# Patient Record
Sex: Male | Born: 1970 | Race: Black or African American | Hispanic: No | Marital: Single | Smoking: Current every day smoker
Health system: Southern US, Community
[De-identification: ages and names within clinical notes are randomized; demographics above are authoritative.]

## PROBLEM LIST (undated history)

## (undated) HISTORY — PX: MANDIBLE FRACTURE SURGERY: SHX706

---

## 1999-08-22 ENCOUNTER — Emergency Department (HOSPITAL_COMMUNITY): Admission: EM | Admit: 1999-08-22 | Discharge: 1999-08-22 | Payer: Self-pay | Admitting: Emergency Medicine

## 2009-06-15 ENCOUNTER — Inpatient Hospital Stay (HOSPITAL_COMMUNITY): Admission: EM | Admit: 2009-06-15 | Discharge: 2009-06-16 | Payer: Self-pay | Admitting: Emergency Medicine

## 2009-07-28 ENCOUNTER — Ambulatory Visit (HOSPITAL_COMMUNITY): Admission: RE | Admit: 2009-07-28 | Discharge: 2009-07-28 | Payer: Self-pay | Admitting: Oral Surgery

## 2011-01-24 LAB — HEPATIC FUNCTION PANEL
Albumin: 4 g/dL (ref 3.5–5.2)
Bilirubin, Direct: 0.1 mg/dL (ref 0.0–0.3)
Total Bilirubin: 0.9 mg/dL (ref 0.3–1.2)

## 2011-01-24 LAB — ANAEROBIC CULTURE

## 2011-01-24 LAB — CBC
HCT: 41.4 % (ref 39.0–52.0)
Hemoglobin: 13.9 g/dL (ref 13.0–17.0)
MCHC: 33.6 g/dL (ref 30.0–36.0)
Platelets: 299 10*3/uL (ref 150–400)
RDW: 12.6 % (ref 11.5–15.5)

## 2011-01-24 LAB — BASIC METABOLIC PANEL
BUN: 9 mg/dL (ref 6–23)
CO2: 27 mEq/L (ref 19–32)
Calcium: 9.6 mg/dL (ref 8.4–10.5)
Glucose, Bld: 111 mg/dL — ABNORMAL HIGH (ref 70–99)
Sodium: 138 mEq/L (ref 135–145)

## 2011-01-24 LAB — CULTURE, ROUTINE-ABSCESS

## 2011-01-26 LAB — DIFFERENTIAL
Basophils Absolute: 0 10*3/uL (ref 0.0–0.1)
Lymphocytes Relative: 14 % (ref 12–46)
Lymphs Abs: 1.5 10*3/uL (ref 0.7–4.0)
Monocytes Absolute: 0.9 10*3/uL (ref 0.1–1.0)
Monocytes Relative: 8 % (ref 3–12)
Neutro Abs: 8.4 10*3/uL — ABNORMAL HIGH (ref 1.7–7.7)

## 2011-01-26 LAB — BASIC METABOLIC PANEL
Calcium: 8.8 mg/dL (ref 8.4–10.5)
GFR calc Af Amer: >60 mL/min (ref >60)
GFR calc non Af Amer: >60 mL/min (ref >60)
Sodium: 138 mEq/L (ref 135–145)

## 2011-01-26 LAB — PROTIME-INR: INR: 1.1 (ref 0.00–1.49)

## 2011-01-26 LAB — CBC
Hemoglobin: 12.6 g/dL — ABNORMAL LOW (ref 13.0–17.0)
RBC: 3.82 MIL/uL — ABNORMAL LOW (ref 4.22–5.81)
WBC: 10.8 10*3/uL — ABNORMAL HIGH (ref 4.0–10.5)
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
## 2011-03-05 NOTE — Op Note (Signed)
NAMEARSEN, MANGIONE                ACCOUNT NO.:  [id number]   MEDICAL RECORD NO.:  [id number]          PATIENT TYPE:  INP   LOCATION:  5127                         FACILITY:  [facility]   PHYSICIAN:  Georgia Lopes, M.D.  DATE OF BIRTH:  [birth year]   DATE OF PROCEDURE:  06/15/2009  DATE OF DISCHARGE:                               OPERATIVE REPORT   OPERATIVE DIAGNOSIS:  Right mandibular parasymphaseal fracture, Left  subcondylar fracture of mandible   POSTOPERATIVE DIAGNOSIS:  Right mandibular parasymphaseal fracture, left  subcondylar fracture of mandible   PROCEDURE:  Open reduction, internal fixation, mandibular right  parasymphaseal fracture, Closed reduction Left Subcondylar fracture of  mandible   SURGEON:  Georgia Lopes, MD   ANESTHESIA:  General, nasal, Dr. Ivin Booty was the attending.   PROCEDURE:  The patient was taken to the operating room and placed on  the table in the supine position.  General anesthesia was administered  via intravenous medications and a nasotracheal tube was placed  atraumatically and secured and the eyes were protected, and the patient  was draped for the procedure.  Posterior pharynx was suctioned and a  throat pack was placed.  A 2% lidocaine with 1:100,000 epinephrine was  infiltrated and an inferior alveolar block on the right and left side  and buccal infiltration in the right symphysial area.  Then, Erich arch  bars were adapted into the maxilla with 24 gauge wire posteriorly and 26-  gauge wires anteriorly.  The arch bar was split in the mandible so that  the segment on the right side received an arch bar with a 24 and 26  gauge wires ligated to the teeth and the  segment on the left had an  arch bar attached to the teeth with 24 and 26 gauge wire.  The fracture  was between teeth numbers 26 and 27.  Then, the occlusion was assessed  and the patient was placed in an intermaxillary fixation manually and a  26-gauge wire was used to ligate the  right arch bar to the left arch  bar.  Then, intermaxillary fixation was placed with 24-gauge wires and a  15 blade was used make a full-thickness incision approximately 4 cm long  the anterior mandible through the mucosa and the submucosal tissues and  down to the periosteum.  Periosteal elevator was used to reflect the  periosteum and the fracture was identified.  The mental nerve was  identified and protected during the procedure.  Then, a four-hole bone  plate was adapted to the fracture.  The fracture was nondisplaced at  that time after the closed reduction and a four-hole bone plate was  adapted and screws were placed, 10-mm 2.3 x 4.  The area was irrigated  and closed in layers with 3-0 chromic.  Then, the maxillary fixation was  released.  The oral cavity was irrigated and suctioned and the throat  pack was removed.  Then, the intermaxillary fixation was reapplied using  24 and 26 gauge wires.  At the termination of the procedure, the  fractures were reduced and occlusion was stable,  jaws were wired shut.   ESTIMATED BLOOD LOSS:  Minimum.   COMPLICATIONS:  None.   SPECIMENS:  None.   DISPOSITION:  To recovery room in good condition, breathing  spontaneously.      Georgia Lopes, M.D.  Electronically Signed     SMJ/MEDQ  D:  06/15/2009  T:  06/16/2009  Job:  [id number]

## 2011-04-16 ENCOUNTER — Emergency Department (HOSPITAL_COMMUNITY)
Admission: EM | Admit: 2011-04-16 | Discharge: 2011-04-16 | Disposition: A | Discharge status: Home / Self Care | Payer: Self-pay | Attending: Emergency Medicine | Admitting: Emergency Medicine

## 2011-04-16 DIAGNOSIS — Z0000 Encounter for general adult medical examination without abnormal findings: Secondary | ICD-10-CM | POA: Insufficient documentation

## 2011-04-16 DIAGNOSIS — Z202 Contact with and (suspected) exposure to infections with a predominantly sexual mode of transmission: Secondary | ICD-10-CM | POA: Insufficient documentation

## 2011-04-16 DIAGNOSIS — F1210 Cannabis abuse, uncomplicated: Secondary | ICD-10-CM | POA: Insufficient documentation

## 2011-04-16 LAB — URINALYSIS, ROUTINE W REFLEX MICROSCOPIC
Ketones, ur: NEGATIVE mg/dL
Leukocytes, UA: NEGATIVE
Nitrite: NEGATIVE
pH: 6 (ref 5.0–8.0)

## 2011-09-03 ENCOUNTER — Emergency Department (INDEPENDENT_AMBULATORY_CARE_PROVIDER_SITE_OTHER): Payer: Self-pay

## 2011-09-03 ENCOUNTER — Encounter: Payer: Self-pay | Admitting: Emergency Medicine

## 2011-09-03 ENCOUNTER — Emergency Department (INDEPENDENT_AMBULATORY_CARE_PROVIDER_SITE_OTHER)
Admission: EM | Admit: 2011-09-03 | Discharge: 2011-09-03 | Disposition: A | Discharge status: Home / Self Care | Payer: Self-pay | Source: Home / Self Care | Attending: Family Medicine | Admitting: Family Medicine

## 2011-09-03 DIAGNOSIS — S2239XA Fracture of one rib, unspecified side, initial encounter for closed fracture: Secondary | ICD-10-CM

## 2011-09-03 MED ORDER — CYCLOBENZAPRINE HCL 5 MG PO TABS: 5.0000 mg | ORAL_TABLET | Freq: Three times a day (TID) | ORAL | Status: AC | PRN

## 2011-09-03 MED ORDER — NAPROXEN 500 MG PO TABS: 500.0000 mg | ORAL_TABLET | Freq: Two times a day (BID) | ORAL | Status: AC

## 2011-09-03 MED ORDER — HYDROCODONE-ACETAMINOPHEN 5-325 MG PO TABS: ORAL_TABLET | ORAL | Status: AC
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
## 2012-09-22 ENCOUNTER — Emergency Department (INDEPENDENT_AMBULATORY_CARE_PROVIDER_SITE_OTHER)
Admission: EM | Admit: 2012-09-22 | Discharge: 2012-09-22 | Disposition: A | Discharge status: Home / Self Care | Payer: Self-pay | Source: Home / Self Care | Attending: Family Medicine | Admitting: Family Medicine

## 2012-09-22 ENCOUNTER — Encounter (HOSPITAL_COMMUNITY): Payer: Self-pay | Admitting: Emergency Medicine

## 2012-09-22 DIAGNOSIS — H16209 Unspecified keratoconjunctivitis, unspecified eye: Secondary | ICD-10-CM

## 2012-09-22 DIAGNOSIS — H16202 Unspecified keratoconjunctivitis, left eye: Secondary | ICD-10-CM

## 2012-09-22 MED ORDER — TETRACAINE HCL 0.5 % OP SOLN
OPHTHALMIC | Status: AC
  Filled 2012-09-22: qty 2

## 2012-09-22 MED ORDER — TOBRAMYCIN 0.3 % OP OINT
TOPICAL_OINTMENT | Freq: Three times a day (TID) | OPHTHALMIC | Status: DC
  Administered 2012-09-22: 15:00:00 via OPHTHALMIC

## 2012-09-22 MED ORDER — TOBRAMYCIN 0.3 % OP OINT
TOPICAL_OINTMENT | OPHTHALMIC | Status: AC
  Filled 2012-09-22: qty 3.5

## 2012-09-22 NOTE — ED Notes (Signed)
Tobramycin applied by dr Artis Flock per patient .  Patient reported dr Artis Flock gave instructions.

## 2012-09-22 NOTE — ED Notes (Signed)
Placed patch on left eye

## 2012-09-22 NOTE — ED Notes (Signed)
Reports yesterday morning woke with left eye red, draining , and painful.  Patient wears contacts, disposables.  Removed contact yesterday and has remained out of left eye.

## 2012-09-22 NOTE — ED Provider Notes (Signed)
History     CSN: [id number]  Arrival date & time 09/22/12  1300   First MD Initiated Contact with Patient 09/22/12 1318      Chief Complaint  Patient presents with  . Eye Pain    (Consider location/radiation/quality/duration/timing/severity/associated sxs/prior treatment) Patient is a 41 y.o. male presenting with eye pain. The history is provided by the patient.  Eye Pain This is a new problem. The current episode started yesterday. The problem has been gradually worsening. Associated symptoms comments: Onset after extended wear of contact, has happened before.Marland Kitchen    History reviewed. No pertinent past medical history.  Past Surgical History  Procedure Date  . Mandible fracture surgery     No family history on file.  History  Substance Use Topics  . Smoking status: Current Every Day Smoker  . Smokeless tobacco: Not on file  . Alcohol Use: Yes      Review of Systems  Constitutional: Negative.   HENT: Negative.   Eyes: Positive for photophobia, pain and redness. Negative for discharge and itching.    Allergies  Review of patient's allergies indicates no known allergies.  Home Medications   Current Outpatient Rx  Name  Route  Sig  Dispense  Refill  . ACETAMINOPHEN 325 MG PO TABS   Oral   Take 650 mg by mouth every 6 (six) hours as needed.           BP 146/96  Pulse 76  Temp 98.7 F (37.1 C) (Oral)  Resp 18  SpO2 100%  Physical Exam  Nursing note and vitals reviewed. Constitutional: He appears well-developed and well-nourished.  HENT:  Head: Normocephalic.  Right Ear: External ear normal.  Left Ear: External ear normal.  Mouth/Throat: Oropharynx is clear and moist.  Eyes: EOM are normal. Pupils are equal, round, and reactive to light. Left eye exhibits no discharge and no exudate. Right conjunctiva is not injected. Left conjunctiva is injected. Left conjunctiva has no hemorrhage.  Slit lamp exam:      The right eye shows no fluorescein uptake and  no anterior chamber bulge.       The left eye shows fluorescein uptake. The left eye shows no corneal abrasion, no corneal flare, no corneal ulcer and no anterior chamber bulge.    ED Course  Procedures (including critical care time)  Labs Reviewed - No data to display No results found.   No diagnosis found.    MDM          Linna Hoff, MD 09/22/12 1430

## 2015-04-13 ENCOUNTER — Emergency Department (HOSPITAL_COMMUNITY): Payer: Medicaid Other

## 2015-04-13 ENCOUNTER — Inpatient Hospital Stay (HOSPITAL_COMMUNITY)
Admission: EM | Admit: 2015-04-13 | Discharge: 2015-04-21 | DRG: 082 | Disposition: E | Payer: Medicaid Other | Attending: General Surgery | Admitting: General Surgery

## 2015-04-13 DIAGNOSIS — D62 Acute posthemorrhagic anemia: Secondary | ICD-10-CM | POA: Diagnosis present

## 2015-04-13 DIAGNOSIS — R578 Other shock: Secondary | ICD-10-CM | POA: Diagnosis present

## 2015-04-13 DIAGNOSIS — F10129 Alcohol abuse with intoxication, unspecified: Secondary | ICD-10-CM | POA: Diagnosis present

## 2015-04-13 DIAGNOSIS — S0193XA Puncture wound without foreign body of unspecified part of head, initial encounter: Secondary | ICD-10-CM

## 2015-04-13 DIAGNOSIS — R001 Bradycardia, unspecified: Secondary | ICD-10-CM | POA: Diagnosis present

## 2015-04-13 DIAGNOSIS — X72XXXA Intentional self-harm by handgun discharge, initial encounter: Secondary | ICD-10-CM

## 2015-04-13 DIAGNOSIS — S020XXB Fracture of vault of skull, initial encounter for open fracture: Secondary | ICD-10-CM | POA: Diagnosis present

## 2015-04-13 DIAGNOSIS — S069XAA Unspecified intracranial injury with loss of consciousness status unknown, initial encounter: Secondary | ICD-10-CM | POA: Diagnosis present

## 2015-04-13 DIAGNOSIS — J9601 Acute respiratory failure with hypoxia: Secondary | ICD-10-CM | POA: Diagnosis present

## 2015-04-13 DIAGNOSIS — G9382 Brain death: Secondary | ICD-10-CM | POA: Diagnosis present

## 2015-04-13 DIAGNOSIS — S0990XA Unspecified injury of head, initial encounter: Secondary | ICD-10-CM | POA: Diagnosis present

## 2015-04-13 DIAGNOSIS — F17210 Nicotine dependence, cigarettes, uncomplicated: Secondary | ICD-10-CM | POA: Diagnosis present

## 2015-04-13 DIAGNOSIS — N179 Acute kidney failure, unspecified: Secondary | ICD-10-CM | POA: Diagnosis present

## 2015-04-13 DIAGNOSIS — G936 Cerebral edema: Secondary | ICD-10-CM | POA: Diagnosis present

## 2015-04-13 DIAGNOSIS — S069X9A Unspecified intracranial injury with loss of consciousness of unspecified duration, initial encounter: Secondary | ICD-10-CM | POA: Diagnosis present

## 2015-04-13 DIAGNOSIS — N289 Disorder of kidney and ureter, unspecified: Secondary | ICD-10-CM | POA: Diagnosis present

## 2015-04-13 DIAGNOSIS — J9600 Acute respiratory failure, unspecified whether with hypoxia or hypercapnia: Secondary | ICD-10-CM | POA: Diagnosis present

## 2015-04-13 DIAGNOSIS — W3400XA Accidental discharge from unspecified firearms or gun, initial encounter: Secondary | ICD-10-CM

## 2015-04-13 LAB — PREPARE FRESH FROZEN PLASMA
Unit division: 0
Unit division: 0

## 2015-04-13 LAB — TYPE AND SCREEN
ABO/RH(D): A POS
ANTIBODY SCREEN: NEGATIVE
UNIT DIVISION: 0
Unit division: 0

## 2015-04-13 LAB — COMPREHENSIVE METABOLIC PANEL
ALBUMIN: 2.9 g/dL — AB (ref 3.5–5.0)
ALK PHOS: 39 U/L (ref 38–126)
ALT: 16 U/L — ABNORMAL LOW (ref 17–63)
ANION GAP: 9 (ref 5–15)
AST: 31 U/L (ref 15–41)
BUN: 9 mg/dL (ref 6–20)
CALCIUM: 7.4 mg/dL — AB (ref 8.9–10.3)
CHLORIDE: 108 mmol/L (ref 101–111)
CO2: 21 mmol/L — AB (ref 22–32)
Creatinine, Ser: 1.38 mg/dL — ABNORMAL HIGH (ref 0.61–1.24)
GFR calc Af Amer: >60 mL/min (ref >60)
GFR calc non Af Amer: >60 mL/min (ref >60)
GLUCOSE: 286 mg/dL — AB (ref 65–99)
POTASSIUM: 3.6 mmol/L (ref 3.5–5.1)
SODIUM: 138 mmol/L (ref 135–145)
TOTAL PROTEIN: 4.7 g/dL — AB (ref 6.5–8.1)
Total Bilirubin: 0.2 mg/dL — ABNORMAL LOW (ref 0.3–1.2)

## 2015-04-13 LAB — I-STAT CHEM 8, ED
BUN: 12 mg/dL (ref 6–20)
CHLORIDE: 105 mmol/L (ref 101–111)
Calcium, Ion: 1.08 mmol/L — ABNORMAL LOW (ref 1.12–1.23)
Creatinine, Ser: 1.7 mg/dL — ABNORMAL HIGH (ref 0.61–1.24)
Glucose, Bld: 281 mg/dL — ABNORMAL HIGH (ref 65–99)
HEMATOCRIT: 28 % — AB (ref 39.0–52.0)
HEMOGLOBIN: 9.5 g/dL — AB (ref 13.0–17.0)
Potassium: 3.8 mmol/L (ref 3.5–5.1)
Sodium: 138 mmol/L (ref 135–145)
TCO2: 20 mmol/L (ref 0–100)

## 2015-04-13 LAB — ETHANOL: Alcohol, Ethyl (B): 260 mg/dL — ABNORMAL HIGH (ref <5)

## 2015-04-13 LAB — I-STAT CG4 LACTIC ACID, ED: Lactic Acid, Venous: 3.12 mmol/L (ref 0.5–2.0)

## 2015-04-13 LAB — CDS SEROLOGY

## 2015-04-13 LAB — PROTIME-INR
INR: 1.59 — ABNORMAL HIGH (ref 0.00–1.49)
Prothrombin Time: 19 seconds — ABNORMAL HIGH (ref 11.6–15.2)

## 2015-04-13 LAB — CBC
HCT: 26.9 % — ABNORMAL LOW (ref 39.0–52.0)
Hemoglobin: 8.9 g/dL — ABNORMAL LOW (ref 13.0–17.0)
MCH: 31.6 pg (ref 26.0–34.0)
MCHC: 33.1 g/dL (ref 30.0–36.0)
MCV: 95.4 fL (ref 78.0–100.0)
PLATELETS: 233 10*3/uL (ref 150–400)
RBC: 2.82 MIL/uL — ABNORMAL LOW (ref 4.22–5.81)
RDW: 12.3 % (ref 11.5–15.5)
WBC: 5.9 10*3/uL (ref 4.0–10.5)

## 2015-04-13 LAB — ABO/RH: ABO/RH(D): A POS

## 2015-04-13 MED ORDER — NOREPINEPHRINE BITARTRATE 1 MG/ML IV SOLN: 0.0000 ug/min | Freq: Once | INTRAVENOUS | Status: DC

## 2015-04-13 MED ORDER — SUCCINYLCHOLINE CHLORIDE 20 MG/ML IJ SOLN
INTRAMUSCULAR | Status: AC | PRN
  Administered 2015-04-13: 100 mg via INTRAVENOUS

## 2015-04-13 MED ORDER — ETOMIDATE 2 MG/ML IV SOLN
INTRAVENOUS | Status: AC | PRN
  Administered 2015-04-13: 20 mg via INTRAVENOUS

## 2015-04-13 MED ORDER — PHENYLEPHRINE HCL 10 MG/ML IJ SOLN
0.0000 ug/min | INTRAVENOUS | Status: DC
  Filled 2015-04-13: qty 1

## 2015-04-13 MED FILL — Medication: Qty: 1 | Status: AC

## 2015-04-17 DIAGNOSIS — S069X9A Unspecified intracranial injury with loss of consciousness of unspecified duration, initial encounter: Secondary | ICD-10-CM | POA: Diagnosis present

## 2015-04-17 DIAGNOSIS — S069XAA Unspecified intracranial injury with loss of consciousness status unknown, initial encounter: Secondary | ICD-10-CM | POA: Diagnosis present

## 2015-04-17 DIAGNOSIS — J9600 Acute respiratory failure, unspecified whether with hypoxia or hypercapnia: Secondary | ICD-10-CM | POA: Diagnosis present

## 2015-04-21 NOTE — Progress Notes (Signed)
Patient was intubated in ED and then transported by RN and RT to CT, then transported to [id number].
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
## 2015-04-21 NOTE — ED Notes (Signed)
Verbal order for levophed IV given by Dr. Donell Beers. Mixed and started by [facility] RN initiated at 0046 titrated up to 10 at 0050 increased to 15 at 0052. Increased to 30 by Dr. Donell Beers at [phone]. Titrated up to 40 at 0058

## 2015-04-21 NOTE — ED Notes (Signed)
Airway placed by Dr. Ranae Palms
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
## 2015-04-21 NOTE — ED Notes (Signed)
Patient transported to CT by this RN , RT, Renae Fickle EMT and Dr. Donell Beers. Per Dr. Donell Beers RN kept epi in pocket due to pt bradycardia and hypotension. Pt HR and BP increased upon transport. RN gave bedside report to RNs at 3 MW
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
## 2015-04-21 DEATH — deceased
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
